# Patient Record
Sex: Female | Born: 1985 | Hispanic: No | Marital: Married | Smoking: Never smoker
Health system: Southern US, Community
[De-identification: ages and names within clinical notes are randomized; demographics above are authoritative.]

## PROBLEM LIST (undated history)

## (undated) DIAGNOSIS — N80109 Endometriosis of ovary, unspecified side, unspecified depth: Secondary | ICD-10-CM

## (undated) HISTORY — PX: CYST EXCISION: SHX5701

## (undated) HISTORY — DX: Endometriosis of ovary, unspecified side, unspecified depth: N80.109

---

## 2020-07-18 ENCOUNTER — Encounter: Payer: Self-pay | Admitting: Advanced Practice Midwife

## 2020-07-29 ENCOUNTER — Encounter: Payer: Self-pay | Admitting: Certified Nurse Midwife

## 2020-08-16 ENCOUNTER — Encounter: Payer: Self-pay | Admitting: Advanced Practice Midwife

## 2020-11-15 ENCOUNTER — Other Ambulatory Visit: Payer: Self-pay | Admitting: Obstetrics and Gynecology

## 2020-11-15 DIAGNOSIS — N979 Female infertility, unspecified: Secondary | ICD-10-CM

## 2020-11-18 ENCOUNTER — Ambulatory Visit: Payer: Self-pay

## 2020-12-09 ENCOUNTER — Other Ambulatory Visit (HOSPITAL_COMMUNITY): Payer: Self-pay | Admitting: Obstetrics and Gynecology

## 2020-12-09 ENCOUNTER — Other Ambulatory Visit: Payer: Self-pay | Admitting: Obstetrics and Gynecology

## 2020-12-09 DIAGNOSIS — N979 Female infertility, unspecified: Secondary | ICD-10-CM

## 2020-12-14 ENCOUNTER — Ambulatory Visit
Admission: RE | Admit: 2020-12-14 | Discharge: 2020-12-14 | Disposition: A | Discharge status: Home / Self Care | Payer: BC Managed Care – PPO | Source: Ambulatory Visit | Attending: Obstetrics and Gynecology | Admitting: Obstetrics and Gynecology

## 2020-12-14 ENCOUNTER — Other Ambulatory Visit: Payer: Self-pay

## 2020-12-14 DIAGNOSIS — N979 Female infertility, unspecified: Secondary | ICD-10-CM | POA: Insufficient documentation

## 2020-12-14 MED ORDER — IOHEXOL 300 MG/ML  SOLN
20.0000 mL | Freq: Once | INTRAMUSCULAR | Status: AC | PRN
  Administered 2020-12-14: 20 mL

## 2020-12-14 NOTE — Op Note (Signed)
NAME: Barbara Brock, Barbara Brock MEDICAL RECORD NO: [id number] ACCOUNT NO: [id number] DATE OF BIRTH: 1986-03-19 FACILITY: [facility] LOCATION: [facility] PHYSICIAN: Suzy Bouchard, MD  Operative Report   DATE OF PROCEDURE: 12/14/2020  PREOPERATIVE DIAGNOSIS:  Infertility.  POSTOPERATIVE DIAGNOSIS:  Infertility.  PROCEDURE:  Hysterosalpingogram.  INDICATIONS:  A 35 year old female with a 4-year history of infertility.  The patient is status post prior myomectomy.  DESCRIPTION OF PROCEDURE:  The patient was brought into the radiology suite and a speculum was placed.  Verbal consent for the procedure was obtained.  All questions answered.  Speculum was placed into the vagina and the anterior cervix was grasped with  a single tooth tenaculum and the cervix was dilated with cervical dilators.  Betadine prep to the cervix was performed before this.  HSG catheter was advanced into the endometrial cavity and radiopaque dye was injected during the fluoroscopy.   Radiologist Dr. Register was performing the fluoroscopy.  Initial read demonstrated normal uterine cavity with bilateral fill and spill from each fallopian tube.  The patient tolerated the procedure well.  HSG catheter was removed.  There were no  complications.   PUS D: 12/14/2020 2:13:00 pm T: 12/14/2020 3:09:00 pm  JOB: [id number]/ [id number]

## 2022-02-25 IMAGING — RF DG HYSTEROGRAM
6 series · 6 of 6 positions shown · non-contrast
Comparison: No prior.

CLINICAL DATA: Female infertility.

EXAM:
HYSTEROSALPINGOGRAM
TECHNIQUE: Following hysterosalpingography by gynecology images obtained.

[Series 1: fluoro_hsg_singleshot_bw · 0.17mm/px · 1 of 1 slices shown (1 of 6)]
[im 1/1]
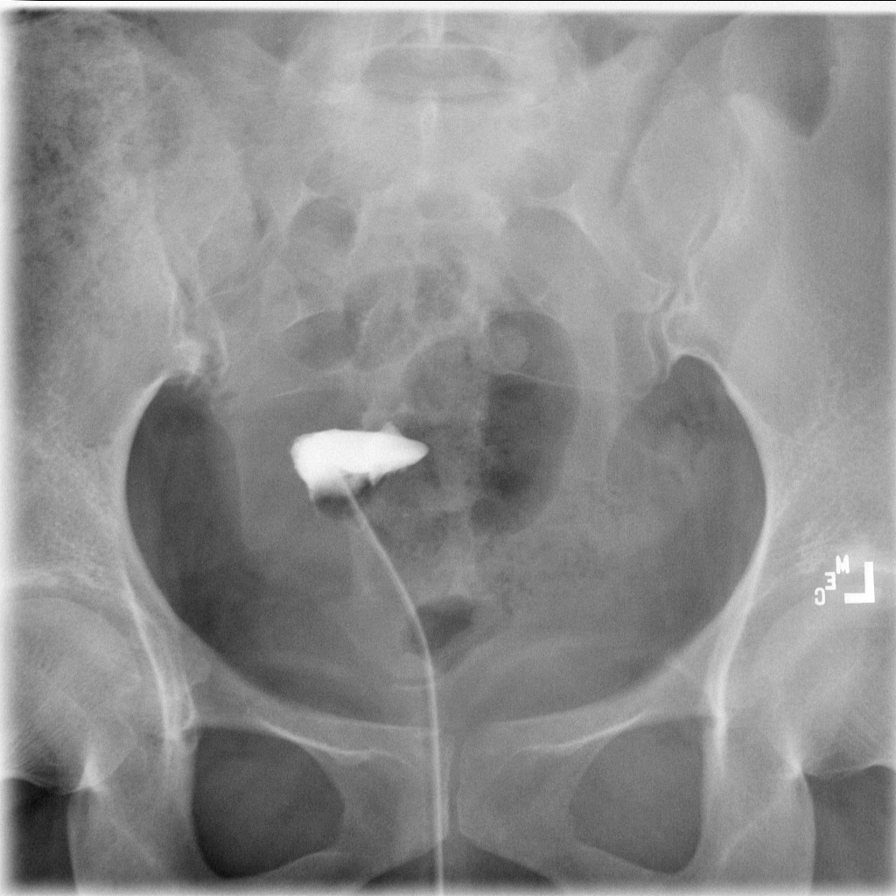

[Series 2: fluoro_hsg_singleshot_bw · 0.17mm/px · 1 of 1 slices shown (2 of 6)]
[im 1/1]
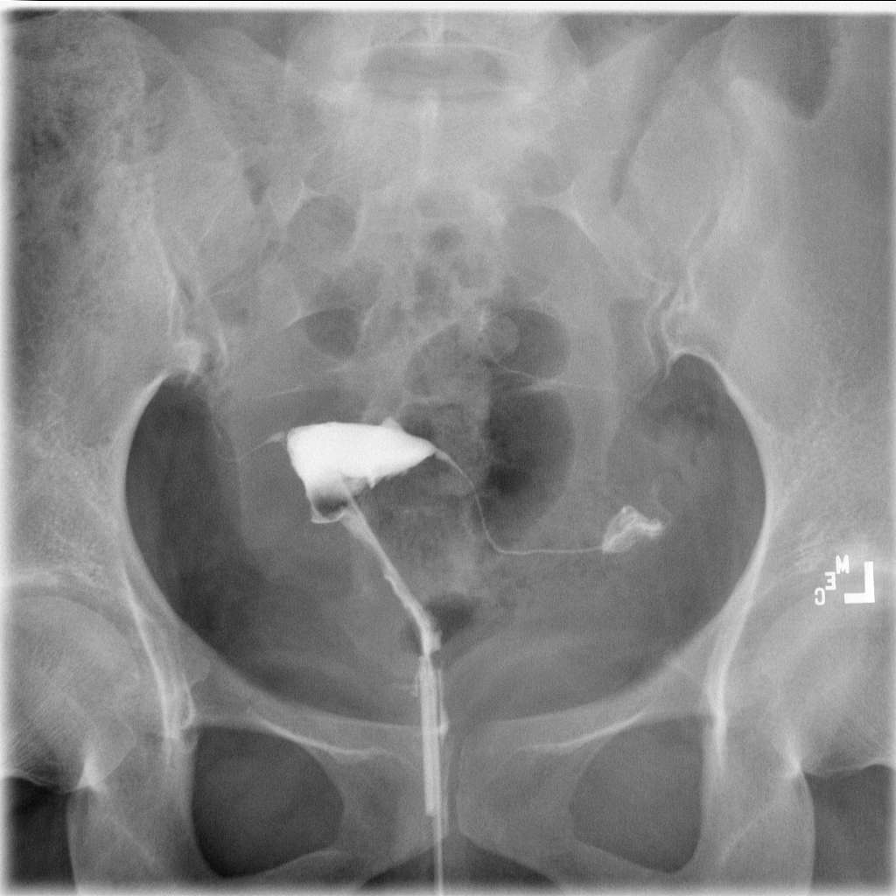

[Series 3: fluoro_hsg_singleshot_bw · 0.17mm/px · 1 of 1 slices shown (3 of 6)]
[im 1/1]
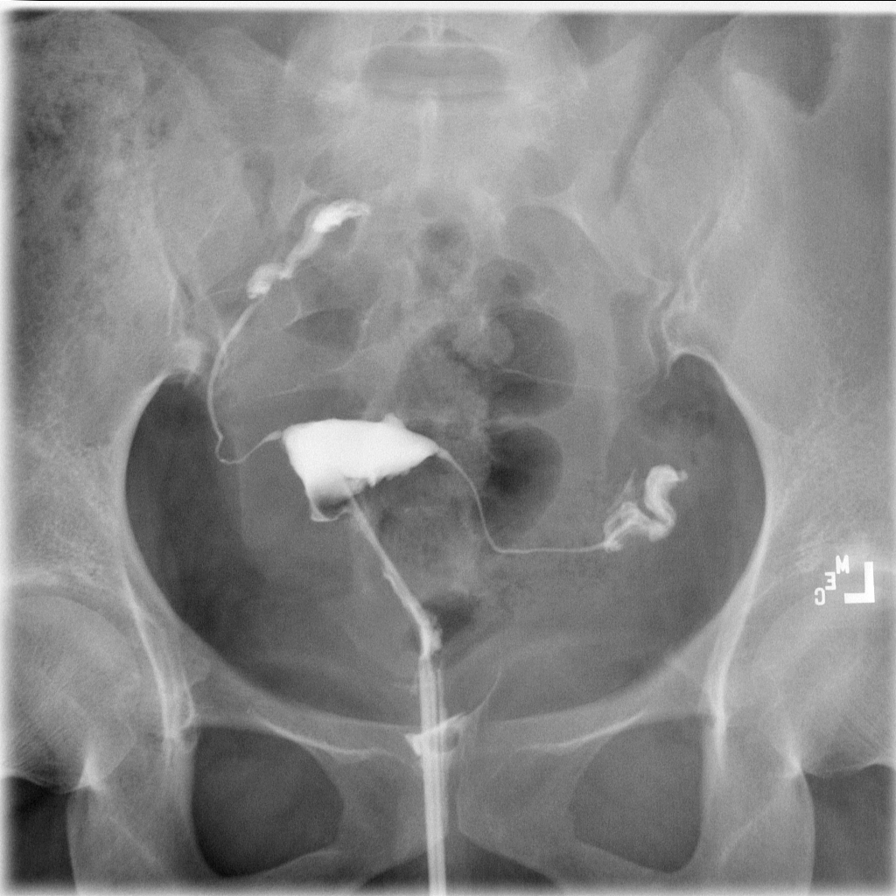

[Series 4: fluoro_hsg_singleshot_bw · 0.17mm/px · 1 of 1 slices shown (4 of 6)]
[im 1/1]
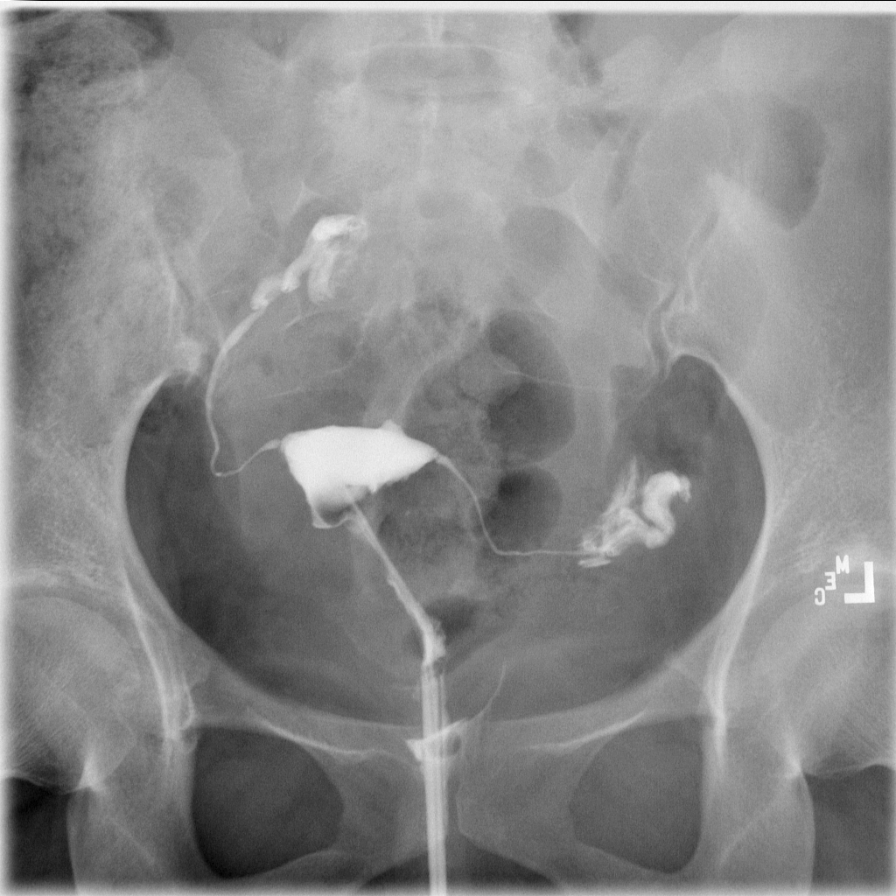

[Series 5: fluoro_hsg_singleshot_bw · 0.17mm/px · 1 of 1 slices shown (5 of 6)]
[im 1/1]
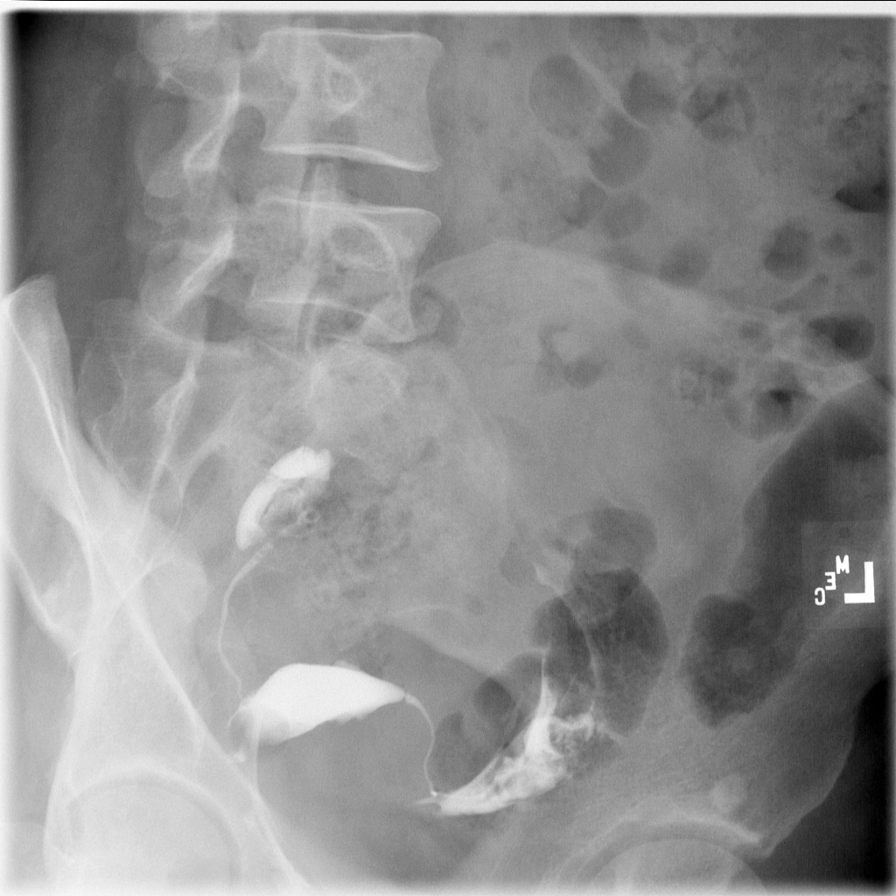

[Series 6: fluoro_hsg_singleshot_bw · 0.17mm/px · 1 of 1 slices shown (6 of 6)]
[im 1/1]
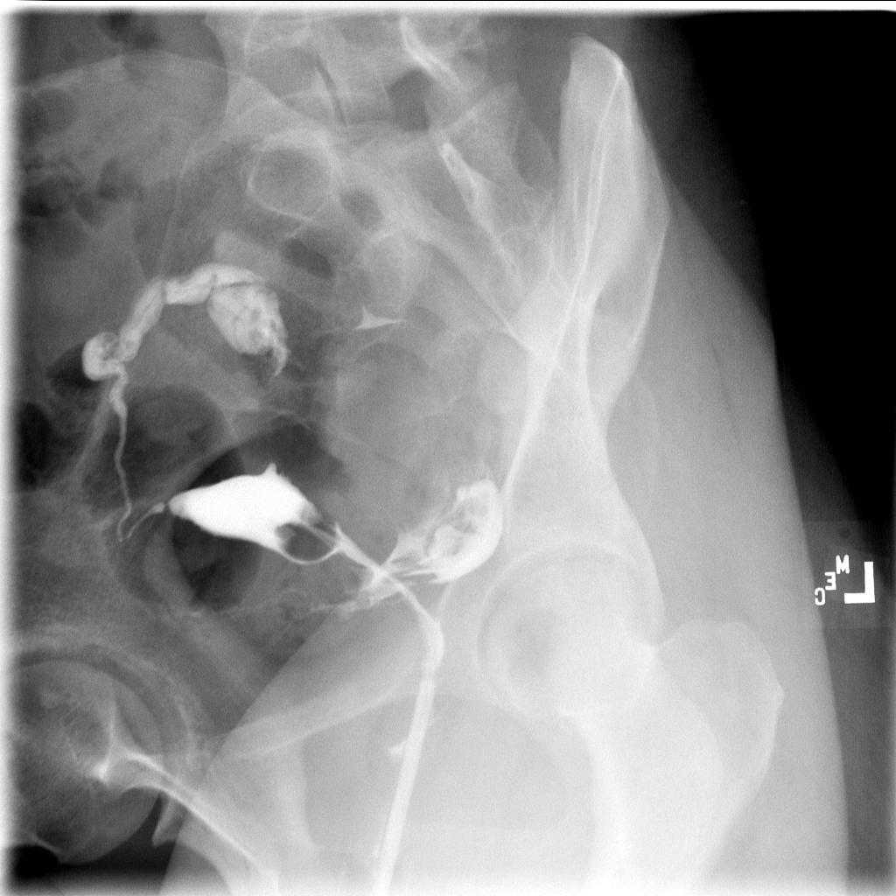

[6 of 6 positions shown; findings below may reference images not displayed]

FLUOROSCOPY TIME:  Radiation Exposure Index (as provided by the
fluoroscopic device): 25.5 mGy

If the device does not provide the exposure index:

Fluoroscopy Time:  1 minutes 12 seconds
FINDINGS: Very mild endometrial irregularity. A process such as endometrial
hyperplasia or adenomyosis cannot be excluded. Fallopian tubes
appear normal bilaterally. Bilateral free spill of contrast noted.
IMPRESSION: 1. Very mild endometrial irregularity. A process such as endometrial
hyperplasia or adenomyosis cannot be excluded.

2.  Fallopian tubes are widely patent.  Bilateral spill noted.

## 2022-05-09 ENCOUNTER — Ambulatory Visit: Payer: BC Managed Care – PPO | Admitting: Family Medicine

## 2022-06-26 ENCOUNTER — Ambulatory Visit: Payer: BC Managed Care – PPO | Admitting: Family

## 2022-07-04 ENCOUNTER — Encounter: Payer: Self-pay | Admitting: Family

## 2022-07-04 ENCOUNTER — Ambulatory Visit (INDEPENDENT_AMBULATORY_CARE_PROVIDER_SITE_OTHER): Payer: BC Managed Care – PPO | Admitting: Family

## 2022-07-04 VITALS — BP 116/74 | HR 91 | Temp 98.6°F | Resp 16 | Ht 64.5 in | Wt 154.0 lb

## 2022-07-04 DIAGNOSIS — Z3009 Encounter for other general counseling and advice on contraception: Secondary | ICD-10-CM

## 2022-07-04 DIAGNOSIS — R350 Frequency of micturition: Secondary | ICD-10-CM | POA: Diagnosis not present

## 2022-07-04 DIAGNOSIS — N926 Irregular menstruation, unspecified: Secondary | ICD-10-CM | POA: Diagnosis not present

## 2022-07-04 DIAGNOSIS — E039 Hypothyroidism, unspecified: Secondary | ICD-10-CM

## 2022-07-04 LAB — POCT URINALYSIS DIP (CLINITEK)
Bilirubin, UA: NEGATIVE
Glucose, UA: NEGATIVE mg/dL
Ketones, POC UA: NEGATIVE mg/dL
Leukocytes, UA: NEGATIVE
Nitrite, UA: NEGATIVE
POC PROTEIN,UA: NEGATIVE
Spec Grav, UA: <=1.005 — AB (ref 1.010–1.025)
Urobilinogen, UA: 0.2 E.U./dL
pH, UA: 6.5 (ref 5.0–8.0)

## 2022-07-04 LAB — HCG, QUANTITATIVE, PREGNANCY: Quantitative HCG: <0.6 m[IU]/mL

## 2022-07-04 LAB — TSH: TSH: 3 u[IU]/mL (ref 0.35–5.50)

## 2022-07-04 NOTE — Assessment & Plan Note (Signed)
poct urine dip in office Urine culture ordered pending results  

## 2022-07-04 NOTE — Progress Notes (Signed)
New Patient Office Visit  Subjective:  Patient ID: Barbara Brock, female    DOB: 1986/07/08  Age: 36 y.o. MRN: [id number]  CC:  Chief Complaint  Patient presents with   Establish Care    HPI Giada Schoppe is here to establish care as a new patient.  Prior provider was: no recent provider, has been seeing obgyn Dr. Laverta Baltimore.  Pt is without acute concerns.   Thinks she may be pregnant. Her husband and her have been trying to get pregnancy, last period was 8/15 , and she feels she should have gotten her last cycle last week. She is requesting to be tested for pregnancy today.   H/o chocolate cyst removal, with h/o endometriosis.   Hypothyroid: on levothyroxine 25 mcg once daily. States that she had her tsh tested one month ago in [place], she will bring in results when she is able. Does feel fatigue at times. She does report dry skin, no joint pain. No recent weight gain, actually weight loss.  Wt Readings from Last 3 Encounters:  07/04/22 154 lb (69.9 kg)     Pap smear: 2020 , repeat in three years, negative. States had a pap in [place] last month 8/23, and states pap was negative.   Past Medical History:  Diagnosis Date   Chocolate cyst of ovary     Past Surgical History:  Procedure Laterality Date   CYST EXCISION     2023, removal of chocolate cyst outside the uterus, removed    Family History  Problem Relation Age of Onset   Healthy Mother    Healthy Father     Social History   Socioeconomic History   Marital status: Married    Spouse name: Not on file   Number of children: 0   Years of education: Not on file   Highest education level: Not on file  Occupational History   Occupation: unemployed  Tobacco Use   Smoking status: Never   Smokeless tobacco: Never  Vaping Use   Vaping Use: Never used  Substance and Sexual Activity   Alcohol use: Not Currently   Drug use: Never   Sexual activity: Yes    Partners: Male    Birth  control/protection: None  Other Topics Concern   Not on file  Social History Narrative   Not on file   Social Determinants of Health   Financial Resource Strain: Not on file  Food Insecurity: Not on file  Transportation Needs: Not on file  Physical Activity: Not on file  Stress: Not on file  Social Connections: Not on file  Intimate Partner Violence: Not on file    Outpatient Medications Prior to Visit  Medication Sig Dispense Refill   levothyroxine (SYNTHROID) 25 MCG tablet Take by mouth.     Prenatal Vit-Fe Fumarate-FA (PRENATAL MULTIVITAMIN) TABS tablet Take 1 tablet by mouth daily at 12 noon.     No facility-administered medications prior to visit.    No Known Allergies      Objective:    Physical Exam Vitals reviewed.  Constitutional:      General: She is not in acute distress.    Appearance: Normal appearance. She is not ill-appearing or toxic-appearing.  HENT:     Right Ear: Tympanic membrane normal.     Left Ear: Tympanic membrane normal.     Mouth/Throat:     Mouth: Mucous membranes are moist.     Pharynx: No pharyngeal swelling.     Tonsils: No tonsillar  exudate.  Eyes:     Extraocular Movements: Extraocular movements intact.     Conjunctiva/sclera: Conjunctivae normal.     Pupils: Pupils are equal, round, and reactive to light.  Neck:     Thyroid: No thyroid mass, thyromegaly or thyroid tenderness.  Cardiovascular:     Rate and Rhythm: Normal rate and regular rhythm.  Pulmonary:     Effort: Pulmonary effort is normal.     Breath sounds: Normal breath sounds.  Abdominal:     General: Abdomen is flat. Bowel sounds are normal.     Palpations: Abdomen is soft.  Musculoskeletal:        General: Normal range of motion.  Lymphadenopathy:     Cervical:     Right cervical: No superficial cervical adenopathy.    Left cervical: No superficial cervical adenopathy.  Skin:    General: Skin is warm.     Capillary Refill: Capillary refill takes less than 2  seconds.  Neurological:     General: No focal deficit present.     Mental Status: She is alert and oriented to person, place, and time.  Psychiatric:        Mood and Affect: Mood normal.        Behavior: Behavior normal.        Thought Content: Thought content normal.        Judgment: Judgment normal.      BP 116/74   Pulse 91   Temp 98.6 F (37 C)   Resp 16   Ht 5' 4.5" (1.638 m)   Wt 154 lb (69.9 kg)   LMP 06/05/2022 (Exact Date)   SpO2 98%   BMI 26.03 kg/m  Wt Readings from Last 3 Encounters:  07/04/22 154 lb (69.9 kg)     Health Maintenance Due  Topic Date Due   HIV Screening  Never done   Hepatitis C Screening  Never done   PAP SMEAR-Modifier  Never done    There are no preventive care reminders to display for this patient.  No results found for: "TSH" No results found for: "WBC", "HGB", "HCT", "MCV", "PLT" No results found for: "NA", "K", "CHLORIDE", "CO2", "GLUCOSE", "BUN", "CREATININE", "BILITOT", "ALKPHOS", "AST", "ALT", "PROT", "ALBUMIN", "CALCIUM", "ANIONGAP", "EGFR", "GFR" No results found for: "CHOL" No results found for: "HDL" No results found for: "LDLCALC" No results found for: "TRIG" No results found for: "CHOLHDL" No results found for: "HGBA1C"    Assessment & Plan:   Problem List Items Addressed This Visit       Endocrine   Acquired hypothyroidism - Primary    Ordering tsh today pending results.        Relevant Medications   levothyroxine (SYNTHROID) 25 MCG tablet   Other Relevant Orders   TSH   Thyroid peroxidase antibody     Other   Family planning education, guidance, and counseling    Referral placed for obgyn  Also testing pt today for pregnancy with hcg quant        Relevant Orders   Ambulatory referral to Obstetrics / Gynecology   Urinary frequency    poct urine dip in office Urine culture ordered pending results       Relevant Orders   POCT URINALYSIS DIP (CLINITEK)   Urine Culture   Missed period    hcg  quant today, pending results      Relevant Orders   hCG, quantitative, pregnancy   Ambulatory referral to Obstetrics / Gynecology    No orders of  the defined types were placed in this encounter.   Follow-up: Return in about 6 months (around 01/02/2023) for regular follow up appt .    Eugenia Pancoast, FNP

## 2022-07-04 NOTE — Assessment & Plan Note (Addendum)
hcg quant today, pending results

## 2022-07-04 NOTE — Assessment & Plan Note (Signed)
Ordering tsh today pending results.

## 2022-07-04 NOTE — Assessment & Plan Note (Signed)
Referral placed for obgyn  Also testing pt today for pregnancy with hcg quant

## 2022-07-04 NOTE — Patient Instructions (Signed)
A referral was placed today for obgyn.  Please let us know if you have not heard back within 2 weeks about the referral.  Checking your urine today to make sure you do not have an infection in your urine.  I have also ordered blood work to see if you are pregnant.   For your thyroid I am pending the results of a thyroid test to make sure your levels are as expected.   Continue levothyroxine 25 mcg once daily. You can take at night time before bed. You just want to make sure you do not eat food or take any other medications within one hour of taking medication.    Regards,   Mort Sawyers FNP-C

## 2022-07-05 LAB — URINE CULTURE
MICRO NUMBER:: 13911983
SPECIMEN QUALITY:: ADEQUATE

## 2022-07-05 LAB — THYROID PEROXIDASE ANTIBODY: Thyroperoxidase Ab SerPl-aCnc: 1 IU/mL (ref <9)

## 2022-08-08 ENCOUNTER — Other Ambulatory Visit: Payer: Self-pay | Admitting: Family

## 2022-08-08 DIAGNOSIS — R319 Hematuria, unspecified: Secondary | ICD-10-CM

## 2022-08-08 NOTE — Progress Notes (Unsigned)
ematur

## 2022-08-15 ENCOUNTER — Telehealth: Payer: Self-pay | Admitting: Family

## 2022-08-15 NOTE — Telephone Encounter (Signed)
Patient would like to know if Lawerance Bach would like her to come in to get another pregnancy test done. She seen tabitha last month and received one,that was negative. She stated that she has had another missed cycle,and she has been having white discharge first thing in the mornings. Please advise.

## 2022-08-15 NOTE — Telephone Encounter (Signed)
Made an appt for 10/30/203 she stated that she has white discharge in the mornings.

## 2022-08-16 NOTE — Telephone Encounter (Signed)
Agree with precautions given to pt  Agree with nurse assessment in plan.  Thank you for speaking with them. 

## 2022-08-20 ENCOUNTER — Ambulatory Visit: Payer: BC Managed Care – PPO | Admitting: Family

## 2022-08-22 ENCOUNTER — Ambulatory Visit: Payer: BC Managed Care – PPO | Admitting: Family

## 2022-08-28 ENCOUNTER — Ambulatory Visit: Payer: BC Managed Care – PPO | Admitting: Family

## 2022-09-04 ENCOUNTER — Ambulatory Visit: Payer: BC Managed Care – PPO | Admitting: Family

## 2022-09-07 ENCOUNTER — Ambulatory Visit: Payer: BC Managed Care – PPO | Admitting: Family

## 2022-09-24 ENCOUNTER — Encounter: Payer: BC Managed Care – PPO | Admitting: Family Medicine

## 2022-10-04 ENCOUNTER — Encounter: Payer: BC Managed Care – PPO | Admitting: Certified Nurse Midwife

## 2022-10-16 ENCOUNTER — Ambulatory Visit: Payer: BC Managed Care – PPO | Admitting: Family Medicine

## 2022-10-26 ENCOUNTER — Ambulatory Visit: Payer: BC Managed Care – PPO | Admitting: Family

## 2022-11-14 ENCOUNTER — Encounter: Payer: BC Managed Care – PPO | Admitting: Certified Nurse Midwife

## 2022-12-04 ENCOUNTER — Encounter: Payer: BC Managed Care – PPO | Admitting: Certified Nurse Midwife

## 2022-12-28 ENCOUNTER — Encounter: Payer: BC Managed Care – PPO | Admitting: Obstetrics & Gynecology

## 2023-01-30 ENCOUNTER — Encounter: Payer: Self-pay | Admitting: Certified Nurse Midwife

## 2023-03-05 ENCOUNTER — Encounter: Payer: Self-pay | Admitting: Certified Nurse Midwife

## 2023-03-21 ENCOUNTER — Ambulatory Visit: Payer: Self-pay

## 2023-04-10 ENCOUNTER — Encounter: Payer: Self-pay | Admitting: Certified Nurse Midwife

## 2023-05-30 ENCOUNTER — Emergency Department (HOSPITAL_COMMUNITY): Admission: EM | Admit: 2023-05-30 | Payer: BC Managed Care – PPO | Source: Home / Self Care

## 2023-05-30 ENCOUNTER — Telehealth: Payer: Self-pay | Admitting: Family

## 2023-05-30 NOTE — Telephone Encounter (Signed)
Prescription Request  05/30/2023  LOV: 07/04/2022  What is the name of the medication or equipment? levothyroxine (SYNTHROID) 25 MCG tablet   Have you contacted your pharmacy to request a refill? No   Which pharmacy would you like this sent to?  [id number] [place] [place] - [address] [place] [address] Phone: [phone] Fax: [phone]    Patient notified that their request is being sent to the clinical staff for review and that they should receive a response within 2 business days.   Please advise at Mobile [phone] (mobile)  Patient is out of this medication, states she discussed with Wyatt Mage that she will fill this medication from now on

## 2023-05-31 MED ORDER — LEVOTHYROXINE SODIUM 25 MCG PO TABS: 25.0000 ug | ORAL_TABLET | Freq: Every day | ORAL | 0 refills | Status: DC

## 2023-05-31 NOTE — Telephone Encounter (Signed)
30 day script sent in.

## 2023-05-31 NOTE — Telephone Encounter (Signed)
Reviewed patient chart never given by you. The last office note you wanted her to follow up in March. That was not scheduled. Called patient she will be out of town after Monday until next month. I have set up for follow up on 06/24/23. She would like [facility]. She states she will be out before Monday. Ok to call in 30 day until follow up? Sending to Audria Nine NP for review patient PCP is out of the office today.

## 2023-06-03 NOTE — Telephone Encounter (Signed)
Noted.  Will see her as scheduled in September.

## 2023-06-05 ENCOUNTER — Ambulatory Visit: Payer: BC Managed Care – PPO | Admitting: Family Medicine

## 2023-06-05 ENCOUNTER — Telehealth: Payer: Self-pay | Admitting: Family

## 2023-06-05 NOTE — Telephone Encounter (Signed)
Please evaluate.  Triage as necessary. However pt will need to seen.

## 2023-06-05 NOTE — Telephone Encounter (Signed)
Pt called in stated she is in a lot of discomfort and pain would like a referral to see [facility] in [place] [phone]

## 2023-06-05 NOTE — Telephone Encounter (Signed)
Patient has been having anal pain for 2 1/2 weeks. She denies any blood in stool. Has had normal bowel movements. Patient requesting referral to GI. Advised can not do until we have evaluated for patient. She requested to be seen today. Nothing open in our office was able to schedule patient with Dr. Clent Ridges at [facility]. Patient provided with office address. She will let us know if any questions.

## 2023-06-06 ENCOUNTER — Encounter (INDEPENDENT_AMBULATORY_CARE_PROVIDER_SITE_OTHER): Payer: Self-pay

## 2023-06-06 NOTE — Telephone Encounter (Signed)
Noted  

## 2023-06-25 ENCOUNTER — Other Ambulatory Visit: Payer: Self-pay | Admitting: Nurse Practitioner

## 2023-06-25 ENCOUNTER — Telehealth: Payer: Self-pay | Admitting: Family

## 2023-06-25 ENCOUNTER — Ambulatory Visit: Payer: Self-pay | Admitting: Family

## 2023-06-25 DIAGNOSIS — E039 Hypothyroidism, unspecified: Secondary | ICD-10-CM

## 2023-06-25 MED ORDER — LEVOTHYROXINE SODIUM 25 MCG PO TABS: 25.0000 ug | ORAL_TABLET | Freq: Every day | ORAL | 0 refills | Status: DC

## 2023-06-25 NOTE — Telephone Encounter (Signed)
Prescription Request  06/25/2023  LOV: 07/04/2022  What is the name of the medication or equipment?  levothyroxine (SYNTHROID) 25 MCG tablet  Patient asking why she is not getting 180 tablets, only 30  Will be out before next appointment  Have you contacted your pharmacy to request a refill? Yes  Which pharmacy would you like this sent to?  [id number] [place], [place] - [address] [place] [address] Phone: [phone] Fax: [phone]    Patient notified that their request is being sent to the clinical staff for review and that they should receive a response within 2 business days.   Please advise at [facility] [phone]

## 2023-06-25 NOTE — Telephone Encounter (Signed)
Thanks for update.  Cancelled appt 9/5 , 10/30, 11/1, 11/7, 11/14, 11/17, 12/26, 1, 9/3 (x9) Will be sending in enough pills for 9/24 appt however if she no shows/cancels this next appt will be dismissed for frequent cancellations/no shows.

## 2023-06-25 NOTE — Telephone Encounter (Signed)
The message below was obtained during a reschedule request from the patient called in to after hours on 06/24/23 at 4:08pm

## 2023-07-15 ENCOUNTER — Encounter: Payer: Self-pay | Admitting: Family

## 2023-07-15 ENCOUNTER — Telehealth: Payer: Self-pay | Admitting: Family

## 2023-07-15 MED ORDER — LEVOTHYROXINE SODIUM 25 MCG PO TABS: 25.0000 ug | ORAL_TABLET | Freq: Every day | ORAL | 0 refills | Status: AC

## 2023-07-15 NOTE — Telephone Encounter (Signed)
Dismissal letter has been placed in the outgoing mail. Was advised by Vernona Rieger that the letter did not need to be sent certified mail.

## 2023-07-15 NOTE — Addendum Note (Signed)
Addended by: Mort Sawyers on: 07/15/2023 02:04 PM   Modules accepted: Orders

## 2023-07-15 NOTE — Telephone Encounter (Signed)
Could you please note in the record that pt has been dismissed from my care? Everything else has been appropriately documented and taken care of. Thanks!

## 2023-07-15 NOTE — Telephone Encounter (Signed)
Per Wyatt Mage - Due numerous cancellations of appointments, pt is being discharged from Tabitha's care. Wyatt Mage will provide pt with a 30 day supply of her medication, in that time the patient will need to obtain medical care from another provider. Rx has already been sent in.  Spoke with pt. She is aware of this information. Pt was very argumentative on the phone, demanding that she doesn't have to keep appointments because she sees a doctor in [place] and that [place] was aware of this. Pt continue to talk over me and argue about why she was being dismissed. Advised pt that I would not continue to be talked over and advised her that I would be ending the call. She continued to be argumentative and wanted to speak to [place]. Message will be routed to [place].

## 2023-07-15 NOTE — Telephone Encounter (Signed)
  Per Wyatt Mage - Due numerous cancellations of appointments, pt is being discharged from Tabitha's care. Wyatt Mage will provide pt with a 30 day supply of her medication, in that time the patient will need to obtain medical care from another provider. Rx has already been sent in.   Spoke with pt. She is aware of this information. Pt was very argumentative on the phone, demanding that she doesn't have to keep appointments because she sees a doctor in [place] and that [place] was aware of this. Pt continue to talk over me and argue about why she was being dismissed. Advised pt that I would not continue to be talked over and advised her that I would be ending the call. She continued to be argumentative and wanted to speak to [place]. Message will be routed to [place].

## 2023-07-15 NOTE — Telephone Encounter (Signed)
Thank you Mardella Layman.  We are already sending dismissal letter and it has been made very clear that she would be dismissed if she missed her next appt. Amy Marcell Barlow as well had discussed with her the importance of not cancelled her appt 9/24 of which she has done, calling the front desk to cancel saying she didn't need the appt.   At this time I do not feel we need to address this even further.   Can we make sure we send the letter in outbox?

## 2023-07-15 NOTE — Telephone Encounter (Signed)
Pt called In requesting an increase in quantity from 30 to 180 for the meds, levothyroxine (SYNTHROID) 25 MCG tablet? Advised pt of Dugal's message regarding recent appts being cancelled. Pt stated she's currently seeing a few different providers & its hard to attend all her appts. Call back # [phone]

## 2023-07-15 NOTE — Telephone Encounter (Signed)
It looks as though pt cancelled her appt again for 9/24.  She has had 8 cancelled appts over the last one year. She was advised via mychart prior to last scheduled appt for 9/24 -that if she not keep her appt she would be dismissed.   Printed letter, please mail (in outbox) Please call and let pt know that we are dismissing her from our care, she will need to find another provider. Let her know that I will send in one more 30 day refill for her thyroid medication but moving forward she will need to get from her new provider she establishes care with.

## 2023-07-15 NOTE — Telephone Encounter (Signed)
Duplicate

## 2023-07-16 ENCOUNTER — Ambulatory Visit: Payer: Self-pay | Admitting: Family

## 2023-08-30 ENCOUNTER — Other Ambulatory Visit: Payer: Self-pay | Admitting: Family

## 2023-08-30 DIAGNOSIS — E039 Hypothyroidism, unspecified: Secondary | ICD-10-CM

## 2023-10-08 ENCOUNTER — Telehealth: Payer: Self-pay

## 2023-10-08 NOTE — Telephone Encounter (Signed)
Pt called triage asking we can order a beta to confirm her pregnancy. Her husband is out of town and she wants to surprise him w test results. She is new to clinic. Reached out to lead nurse Crystal and was advised since she is not an established pt with [place] we cannot do that. She can reach out to her PCP and request it. There is no medical indication for a beta because pt denies vag spotting/bleeding and/or history of miscarriages.

## 2023-11-04 ENCOUNTER — Encounter: Payer: Self-pay | Admitting: Certified Nurse Midwife
